# Patient Record
Sex: Male | Born: 1995
Health system: Southern US, Community
[De-identification: ages and names within clinical notes are randomized; demographics above are authoritative.]

---

## 2019-04-24 ENCOUNTER — Other Ambulatory Visit: Payer: Self-pay

## 2019-04-24 ENCOUNTER — Encounter (HOSPITAL_BASED_OUTPATIENT_CLINIC_OR_DEPARTMENT_OTHER): Payer: Self-pay | Admitting: Emergency Medicine

## 2019-04-24 ENCOUNTER — Emergency Department (HOSPITAL_BASED_OUTPATIENT_CLINIC_OR_DEPARTMENT_OTHER)
Admission: EM | Admit: 2019-04-24 | Discharge: 2019-04-24 | Disposition: A | Discharge status: Home / Self Care | Payer: Self-pay | Attending: Emergency Medicine | Admitting: Emergency Medicine

## 2019-04-24 DIAGNOSIS — M778 Other enthesopathies, not elsewhere classified: Secondary | ICD-10-CM

## 2019-04-24 DIAGNOSIS — M65821 Other synovitis and tenosynovitis, right upper arm: Secondary | ICD-10-CM | POA: Insufficient documentation

## 2019-04-24 NOTE — ED Triage Notes (Signed)
Pain to right elbow since yesterday.  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

## 2019-04-24 NOTE — Discharge Instructions (Signed)
Please rest the arm for the next day Apply ice to the area that hurts the most Take Ibuprofen 600mg  for pain every 6-8 hours Try a elbow brace to help with pain

## 2019-04-24 NOTE — ED Provider Notes (Signed)
[facility] EMERGENCY DEPARTMENT Provider Note   CSN: [id number] Arrival date & time: 04/24/19  6433     History Chief Complaint  Patient presents with  . Elbow Pain    Mitchell Leblanc is a 24 y.o. male who presents with right elbow pain.  Patient states that this morning he woke up with acute pain in the back of his right elbow.  He works in [employer] and does a lot of heavy lifting.  Bending his elbow makes it worse.  Rest makes it better.  He has tried not tried any medications for pain.  He has not had this pain before.  He states that he came here to get cleared to go back to work. He denies acute injury.  HPI     No past medical history on file.  There are no problems to display for this patient.   The histories are not reviewed yet. Please review them in the "History" navigator section and refresh this [place].     No family history on file.  Social History   Tobacco Use  . Smoking status: Not on file  Substance Use Topics  . Alcohol use: Not on file  . Drug use: Not on file    Home Medications Prior to Admission medications   Not on File    Allergies    Patient has no allergy information on record.  Review of Systems   Review of Systems  Musculoskeletal: Positive for arthralgias. Negative for joint swelling.  Neurological: Negative for weakness and numbness.    Physical Exam Updated Vital Signs BP 120/86 (BP Location: Left Arm)   Pulse 60   Temp 98.5 F (36.9 C) (Oral)   Resp 16   SpO2 100%   Physical Exam Vitals and nursing note reviewed.  Constitutional:      General: He is not in acute distress.    Appearance: He is well-developed.  HENT:     Head: Normocephalic and atraumatic.  Eyes:     General: No scleral icterus.       Right eye: No discharge.        Left eye: No discharge.     Conjunctiva/sclera: Conjunctivae normal.     Pupils: Pupils are equal, round, and reactive to light.  Cardiovascular:     Rate  and Rhythm: Normal rate.  Pulmonary:     Effort: Pulmonary effort is normal. No respiratory distress.  Abdominal:     General: There is no distension.  Musculoskeletal:     Cervical back: Normal range of motion.     Comments: Right elbow: No obvious swelling, deformity, or warmth. Tenderness to palpation over the triceps tendon and medial epicondyle. FROM - pt endorses pain when elbow is flexed past 90 degrees. 5/5 grip strength. Cap refill <2. N/V intact.   Skin:    General: Skin is warm and dry.  Neurological:     Mental Status: He is alert and oriented to person, place, and time.  Psychiatric:        Behavior: Behavior normal.     ED Results / Procedures / Treatments   Labs (all labs ordered are listed, but only abnormal results are displayed) Labs Reviewed - No data to display  EKG None  Radiology No results found.  Procedures Procedures (including critical care time)  Medications Ordered in ED Medications - No data to display  ED Course  I have reviewed the triage vital signs and the nursing notes.  Pertinent labs &  imaging results that were available during my care of the patient were reviewed by me and considered in my medical decision making (see chart for details).  24 year old male with atraumatic right elbow pain in the setting of frequent lifting for his job.  His vital signs are normal.  On exam he is tender over the triceps tendon and medial epicondyle.  He denies any acute trauma.  Imaging is not indicated.  Likely pain is due to tendinitis.  Conservative management discussed and patient given a work note.  MDM Rules/Calculators/A&P                      Final Clinical Impression(s) / ED Diagnoses Final diagnoses:  Triceps tendonitis    Rx / DC Orders ED Discharge Orders    None       Bethel Born, PA-C 04/24/19 3354    Little, Ambrose Finland, MD 04/24/19 1048

## 2019-04-24 NOTE — ED Notes (Signed)
ED Provider at bedside. 

## 2020-06-03 ENCOUNTER — Other Ambulatory Visit (HOSPITAL_COMMUNITY): Payer: Self-pay | Admitting: Physician Assistant

## 2020-06-03 ENCOUNTER — Other Ambulatory Visit: Payer: Self-pay

## 2020-06-03 ENCOUNTER — Encounter (HOSPITAL_BASED_OUTPATIENT_CLINIC_OR_DEPARTMENT_OTHER): Payer: Self-pay

## 2020-06-03 ENCOUNTER — Emergency Department (HOSPITAL_BASED_OUTPATIENT_CLINIC_OR_DEPARTMENT_OTHER): Payer: Self-pay

## 2020-06-03 ENCOUNTER — Emergency Department (HOSPITAL_BASED_OUTPATIENT_CLINIC_OR_DEPARTMENT_OTHER)
Admission: EM | Admit: 2020-06-03 | Discharge: 2020-06-03 | Disposition: A | Discharge status: Home / Self Care | Payer: Self-pay | Attending: Emergency Medicine | Admitting: Emergency Medicine

## 2020-06-03 DIAGNOSIS — S00412A Abrasion of left ear, initial encounter: Secondary | ICD-10-CM | POA: Insufficient documentation

## 2020-06-03 DIAGNOSIS — R519 Headache, unspecified: Secondary | ICD-10-CM | POA: Insufficient documentation

## 2020-06-03 DIAGNOSIS — Y9229 Other specified public building as the place of occurrence of the external cause: Secondary | ICD-10-CM | POA: Insufficient documentation

## 2020-06-03 DIAGNOSIS — W1839XA Other fall on same level, initial encounter: Secondary | ICD-10-CM | POA: Insufficient documentation

## 2020-06-03 DIAGNOSIS — F17200 Nicotine dependence, unspecified, uncomplicated: Secondary | ICD-10-CM | POA: Insufficient documentation

## 2020-06-03 MED ORDER — IBUPROFEN 800 MG PO TABS: 800.0000 mg | ORAL_TABLET | Freq: Three times a day (TID) | ORAL | 0 refills | Status: DC

## 2020-06-03 MED ORDER — KETOROLAC TROMETHAMINE 60 MG/2ML IM SOLN
60.0000 mg | Freq: Once | INTRAMUSCULAR | Status: AC
  Administered 2020-06-03: 60 mg via INTRAMUSCULAR
  Filled 2020-06-03: qty 2

## 2020-06-03 MED ORDER — ONDANSETRON 4 MG PO TBDP
4.0000 mg | ORAL_TABLET | Freq: Once | ORAL | Status: AC
  Administered 2020-06-03: 4 mg via ORAL
  Filled 2020-06-03: qty 1

## 2020-06-03 MED ORDER — ONDANSETRON 4 MG PO TBDP: 4.0000 mg | ORAL_TABLET | Freq: Three times a day (TID) | ORAL | 0 refills | Status: AC | PRN

## 2020-06-03 MED FILL — IBUPROFEN 800 MG TAB: 800 | 7 days supply | Qty: 21 | Fill #0

## 2020-06-03 MED FILL — ONDANSETRON ODT 4 MG TABLET: 4 | 7 days supply | Qty: 20 | Fill #0
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
## 2020-06-03 NOTE — Discharge Instructions (Addendum)
Your CT scan was overall reassuring, you may be experiencing signs of a concussion which includes headache, sensitivity to light and screens.  Please make sure to avoid screens for the next couple days, see the handout for additional recommendations for management of concussions.  Please return to the ER if you have worsening confusion, worsening vomiting, vision changes, increasing sleepiness etc.  You may take ibuprofen for pain, Zofran for nausea.

## 2021-09-16 IMAGING — CT CT HEAD W/O CM
3 series · 15 of 47 positions shown, 18 images · non-contrast
Comparison: Report from head CT 01/08/2009 (images unavailable).

CLINICAL DATA: Head trauma, moderate/severe

EXAM:
CT HEAD WITHOUT CONTRAST
TECHNIQUE: Contiguous axial images were obtained from the base of the skull
through the vertex without intravenous contrast.

[Series 2: head wo · axial · 0.45mm/px · z∈[-162,-37]mm · 9 of 31 slices shown, 12 images]
[im 3/31  brain]
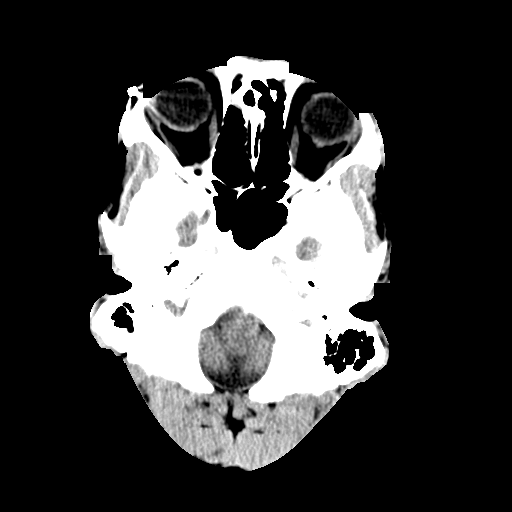
[im 3/31  bone]
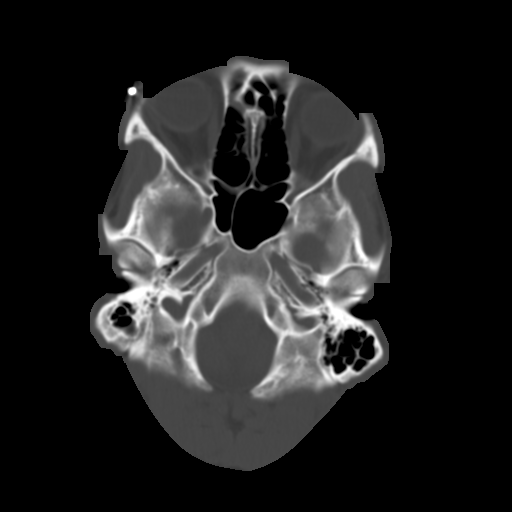
[im 6/31  brain]
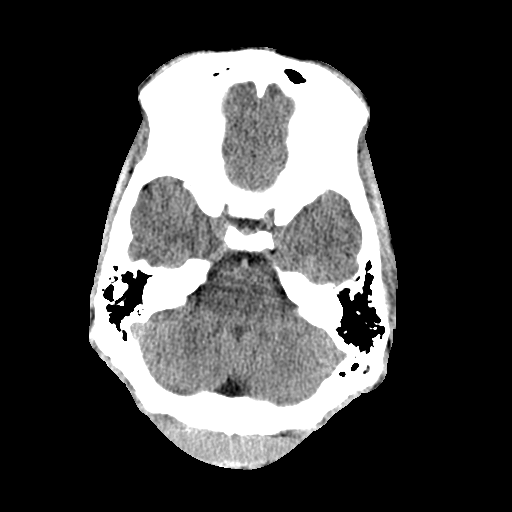
[im 9/31  brain]
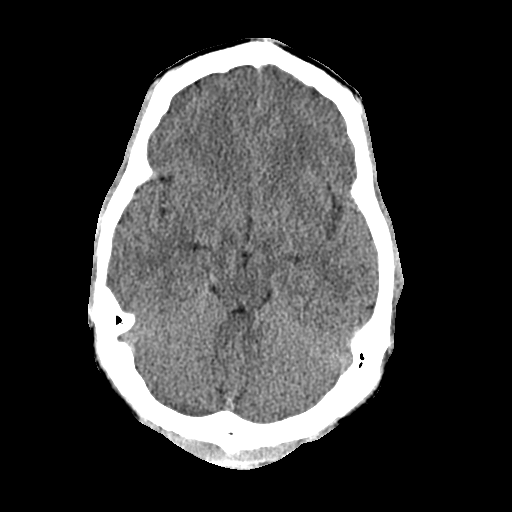
[im 12/31  brain]
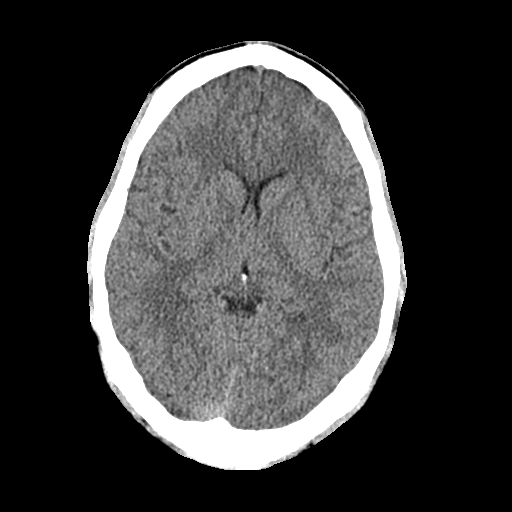
[im 16/31  brain]
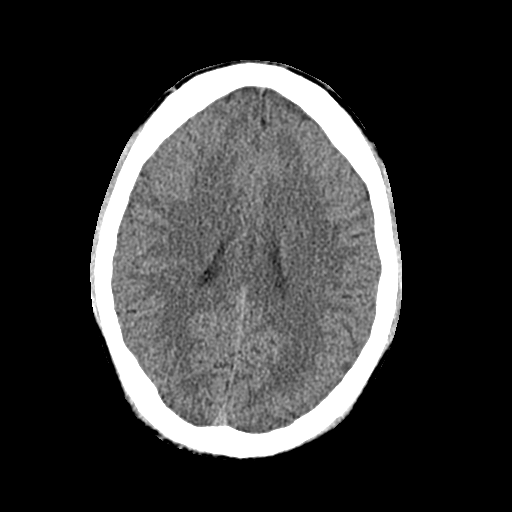
[im 16/31  bone]
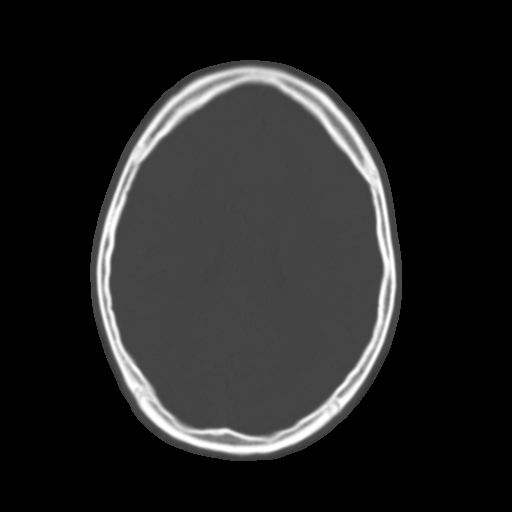
[im 19/31  brain]
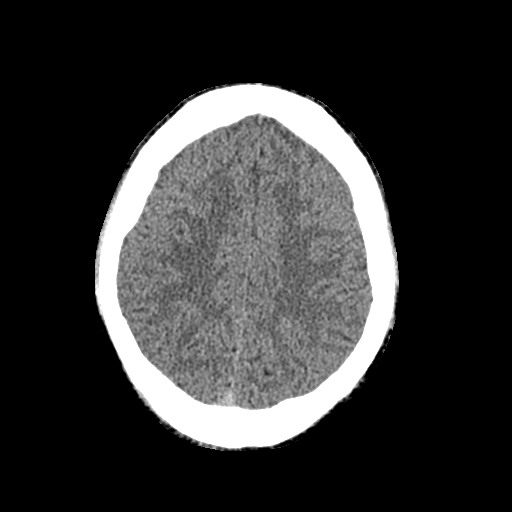
[im 22/31  brain]
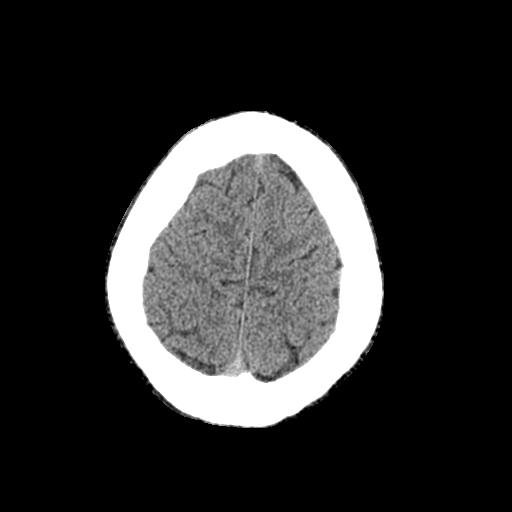
[im 25/31  brain]
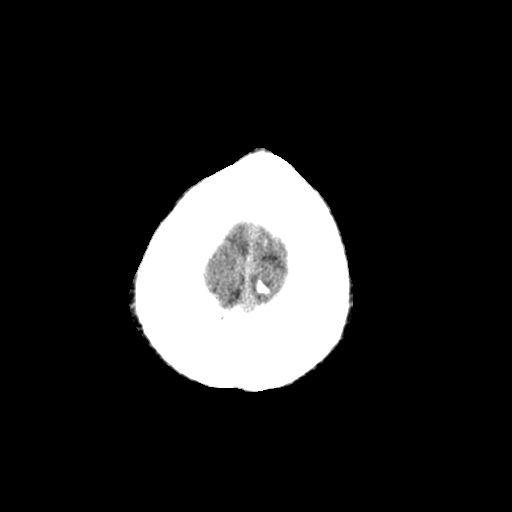
[im 28/31  brain]
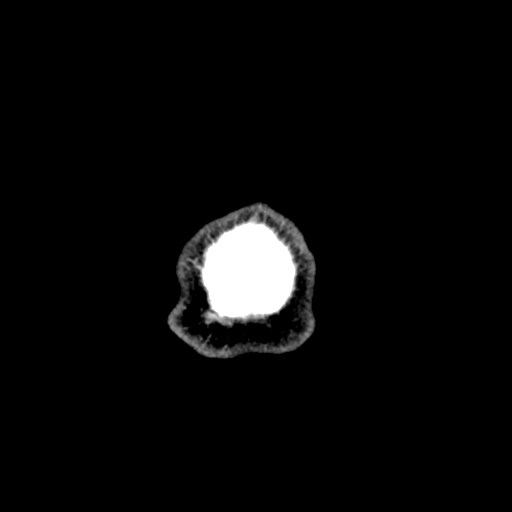
[im 28/31  bone]
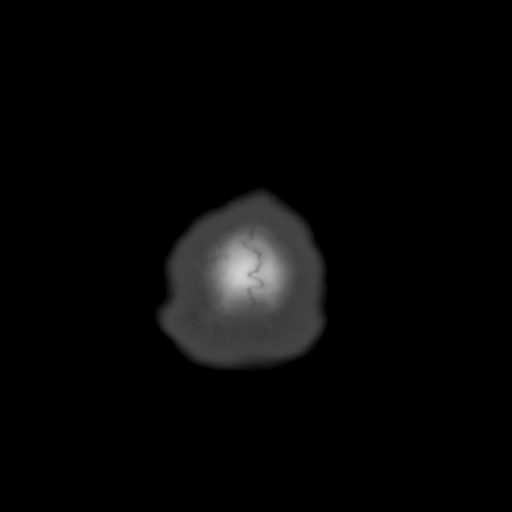

[Series 4: cor soft · coronal · 0.32mm/px · 3 of 69 slices shown]
[im 23/69  brain]
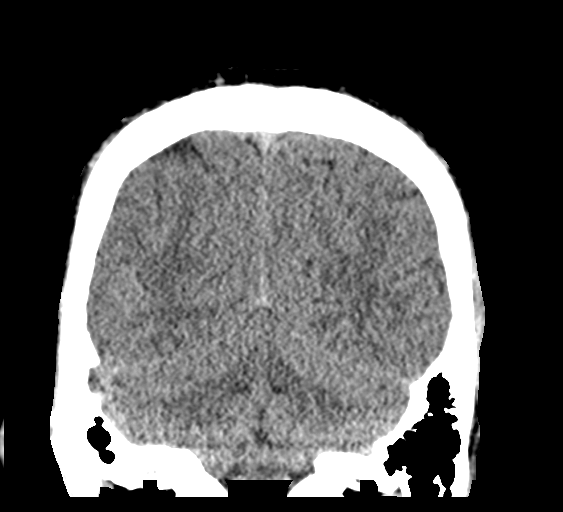
[im 31/69  brain]
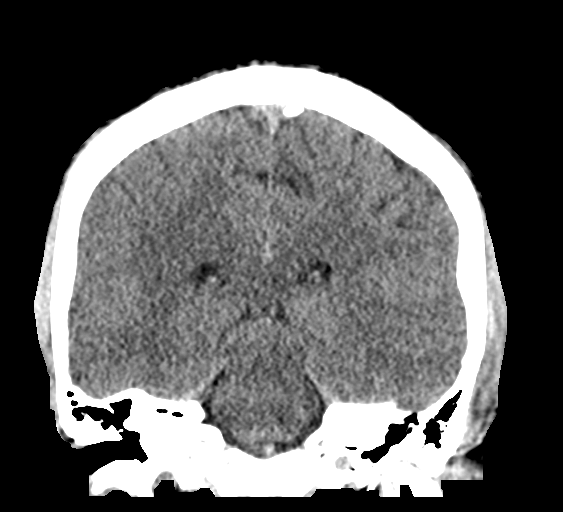
[im 38/69  brain]
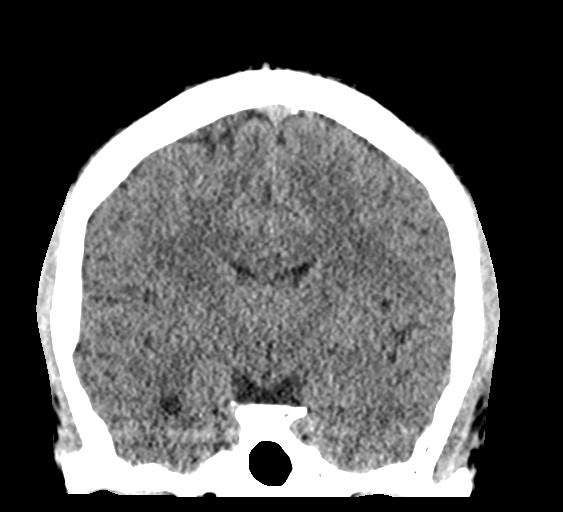

[Series 5: sag soft · sagittal · 0.32mm/px · 3 of 55 slices shown]
[im 19/55  brain]
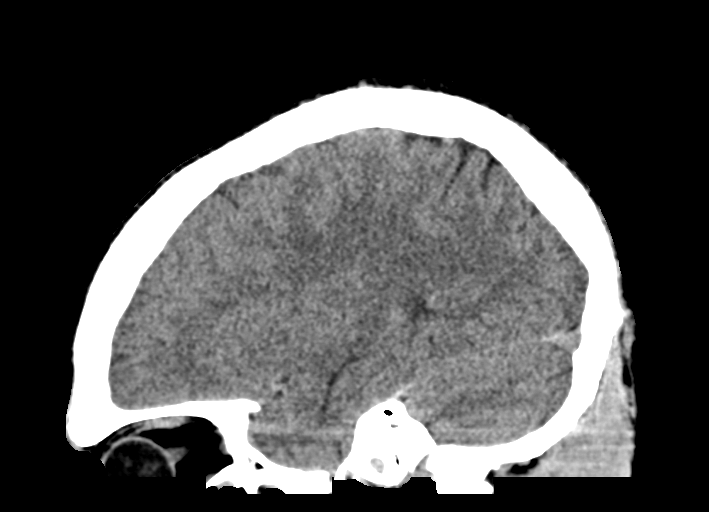
[im 28/55  brain]
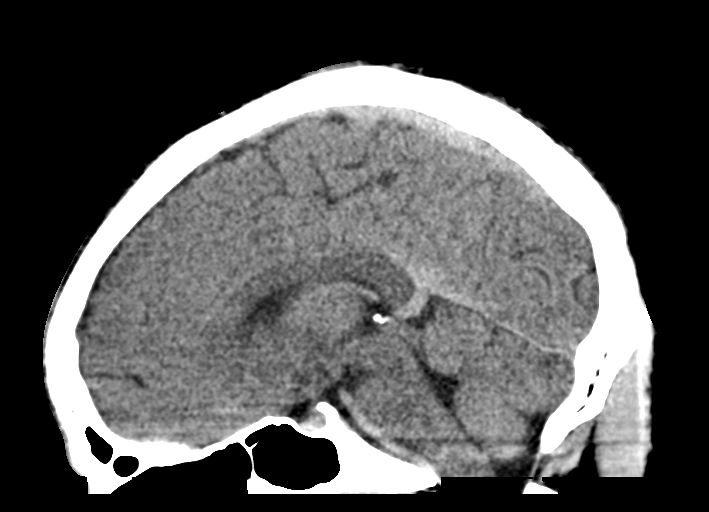
[im 37/55  brain]
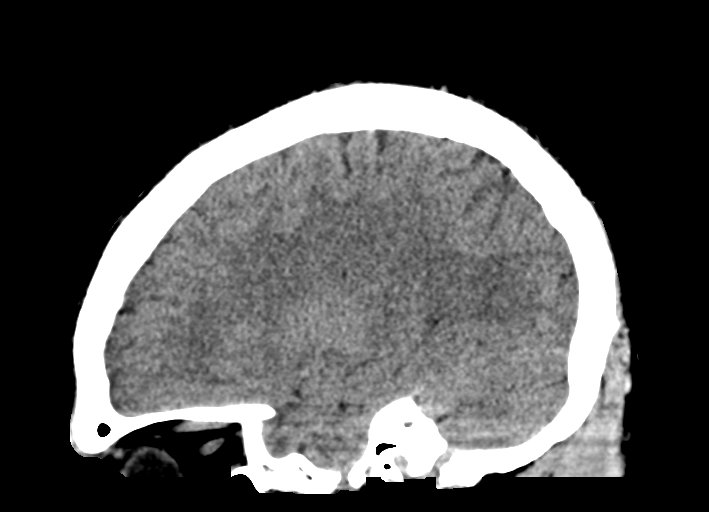

[15 of 47 positions shown; findings below may reference images not displayed]

FINDINGS: Brain:

Cerebral volume is normal.

There is no acute intracranial hemorrhage.

No demarcated cortical infarct.

No extra-axial fluid collection.

No evidence of intracranial mass.

No midline shift.

Vascular: No hyperdense vessel.

Skull: Normal. Negative for fracture or focal lesion.

Sinuses/Orbits: Visualized orbits show no acute finding. Mild right
frontal, bilateral ethmoid and right sphenoid sinus mucosal
thickening. Partially imaged right maxillary sinus mucous retention
cyst.

Other: Left temporal scalp and periauricular soft tissue swelling.
IMPRESSION: No evidence of acute intracranial abnormality.

Left temporal scalp and periauricular soft tissue swelling.

Mild paranasal sinus disease at the imaged levels, as described.

## 2021-11-09 ENCOUNTER — Encounter (HOSPITAL_BASED_OUTPATIENT_CLINIC_OR_DEPARTMENT_OTHER): Payer: Self-pay | Admitting: Emergency Medicine

## 2021-11-09 ENCOUNTER — Emergency Department (HOSPITAL_BASED_OUTPATIENT_CLINIC_OR_DEPARTMENT_OTHER): Payer: Self-pay

## 2021-11-09 ENCOUNTER — Other Ambulatory Visit: Payer: Self-pay

## 2021-11-09 ENCOUNTER — Emergency Department (HOSPITAL_BASED_OUTPATIENT_CLINIC_OR_DEPARTMENT_OTHER)
Admission: EM | Admit: 2021-11-09 | Discharge: 2021-11-09 | Disposition: A | Discharge status: Home / Self Care | Payer: Self-pay | Attending: Emergency Medicine | Admitting: Emergency Medicine

## 2021-11-09 DIAGNOSIS — M25512 Pain in left shoulder: Secondary | ICD-10-CM | POA: Insufficient documentation

## 2021-11-09 DIAGNOSIS — R519 Headache, unspecified: Secondary | ICD-10-CM | POA: Insufficient documentation

## 2021-11-09 DIAGNOSIS — Y9372 Activity, wrestling: Secondary | ICD-10-CM | POA: Insufficient documentation

## 2021-11-09 DIAGNOSIS — S62662A Nondisplaced fracture of distal phalanx of right middle finger, initial encounter for closed fracture: Secondary | ICD-10-CM | POA: Insufficient documentation

## 2021-11-09 DIAGNOSIS — W228XXA Striking against or struck by other objects, initial encounter: Secondary | ICD-10-CM | POA: Insufficient documentation
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
## 2021-11-09 NOTE — ED Notes (Signed)
Patient transported to X-ray 

## 2021-11-09 NOTE — Discharge Instructions (Signed)
Take 4 over the counter ibuprofen tablets 3 times a day or 2 over-the-counter naproxen tablets twice a day for pain. Also take tylenol 1000mg (2 extra strength) four times a day.    The sling is for your comfort only.  You need to take your arm out of the sling at least 4 times a day and perform range of motion exercises.
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
## 2021-11-09 NOTE — ED Notes (Signed)
D/c paperwork reviewed with pt, including f/u care. Pt verbalized understanding, no questions or concerns at time of d/c. Pt ambulatory to ED exit, NAD.

## 2022-05-10 ENCOUNTER — Emergency Department (HOSPITAL_BASED_OUTPATIENT_CLINIC_OR_DEPARTMENT_OTHER): Payer: Self-pay

## 2022-05-10 ENCOUNTER — Other Ambulatory Visit: Payer: Self-pay

## 2022-05-10 ENCOUNTER — Emergency Department (HOSPITAL_BASED_OUTPATIENT_CLINIC_OR_DEPARTMENT_OTHER)
Admission: EM | Admit: 2022-05-10 | Discharge: 2022-05-10 | Disposition: A | Discharge status: Home / Self Care | Payer: Self-pay | Attending: Emergency Medicine | Admitting: Emergency Medicine

## 2022-05-10 ENCOUNTER — Encounter (HOSPITAL_BASED_OUTPATIENT_CLINIC_OR_DEPARTMENT_OTHER): Payer: Self-pay | Admitting: Emergency Medicine

## 2022-05-10 DIAGNOSIS — M79672 Pain in left foot: Secondary | ICD-10-CM | POA: Insufficient documentation

## 2022-05-10 DIAGNOSIS — M549 Dorsalgia, unspecified: Secondary | ICD-10-CM | POA: Insufficient documentation

## 2022-05-10 DIAGNOSIS — Y9372 Activity, wrestling: Secondary | ICD-10-CM | POA: Insufficient documentation

## 2022-05-10 DIAGNOSIS — W108XXA Fall (on) (from) other stairs and steps, initial encounter: Secondary | ICD-10-CM | POA: Insufficient documentation

## 2022-05-10 MED ORDER — ACETAMINOPHEN 500 MG PO TABS: 500.0000 mg | ORAL_TABLET | Freq: Four times a day (QID) | ORAL | 0 refills | Status: AC | PRN

## 2022-05-10 MED ORDER — IBUPROFEN 800 MG PO TABS: 800.0000 mg | ORAL_TABLET | Freq: Three times a day (TID) | ORAL | 0 refills | Status: AC
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
## 2022-05-10 NOTE — ED Provider Notes (Signed)
[facility] Provider Note   CSN: [id number] Arrival date & time: 05/10/22  1258     History  Chief Complaint  Patient presents with   Foot Pain    Mitchell Leblanc is a 27 y.o. male presenting today with pain to his left foot and back.  Reports wrestling and having somebody fell onto his foot and also being thrown onto some stairs.  No bowel/bladder dysfunction, saddle anesthesia, leg weakness or falls.  Is able to bear weight on his left foot but says it is painful.   Foot Pain       Home Medications Prior to Admission medications   Medication Sig Start Date End Date Taking? Authorizing Provider  ibuprofen (ADVIL) 800 MG tablet Take 1 tablet (800 mg total) by mouth 3 (three) times daily. 06/03/20   Garald Balding, PA-C  ondansetron (ZOFRAN ODT) 4 MG disintegrating tablet Take 1 tablet (4 mg total) by mouth every 8 (eight) hours as needed for nausea or vomiting. 06/03/20   Garald Balding, PA-C      Allergies    Patient has no known allergies.    Review of Systems   Review of Systems  Physical Exam Updated Vital Signs BP (!) 124/90 (BP Location: Left Arm)   Pulse 99   Temp 98 F (36.7 C) (Oral)   Resp 16   Ht '6\' 2"'$  (1.88 m)   Wt 81.6 kg   SpO2 98%   BMI 23.11 kg/m  Physical Exam Vitals and nursing note reviewed.  Constitutional:      Appearance: Normal appearance.  HENT:     Head: Normocephalic and atraumatic.  Eyes:     General: No scleral icterus.    Conjunctiva/sclera: Conjunctivae normal.  Pulmonary:     Effort: Pulmonary effort is normal. No respiratory distress.  Musculoskeletal:     Comments: Full range of motion of all levels of the spine.  Full range of motion to the bilateral ankles, no problem with plantarflexion.  Strong DP pulses.  Normal radial pulses as well.  Skin:    Findings: No rash.  Neurological:     Mental Status: He is alert.  Psychiatric:        Mood and Affect: Mood normal.      ED Results / Procedures / Treatments   Labs (all labs ordered are listed, but only abnormal results are displayed) Labs Reviewed - No data to display  EKG None  Radiology DG Thoracic Spine 2 View  Result Date: 05/10/2022 CLINICAL DATA:  Acute mid back pain following injury two days ago. Initial encounter. EXAM: THORACIC SPINE 3 VIEWS COMPARISON:  None Available. FINDINGS: There is no evidence of thoracic spine fracture. Alignment is normal. No other significant bone abnormalities are identified. IMPRESSION: Negative. Electronically Signed   By: Margarette Canada M.D.   On: 05/10/2022 13:47   DG Lumbar Spine Complete  Result Date: 05/10/2022 CLINICAL DATA:  Acute low back pain following injury 2 days ago. Initial encounter. EXAM: LUMBAR SPINE - COMPLETE 5 VIEW COMPARISON:  None Available. FINDINGS: Five non rib-bearing lumbar type vertebra are identified in normal alignment. Disc spaces are maintained. There is no evidence of fracture or subluxation. No focal bony lesion or spondylolysis identified. IMPRESSION: Negative. Electronically Signed   By: Margarette Canada M.D.   On: 05/10/2022 13:46   DG Foot Complete Left  Result Date: 05/10/2022 CLINICAL DATA:  Acute LEFT foot pain following injury 2 days ago. Initial encounter.  EXAM: LEFT FOOT - COMPLETE 3+ VIEW COMPARISON:  None Available. FINDINGS: There is no evidence of fracture or dislocation. There is no evidence of arthropathy or other focal bone abnormality. Soft tissues are unremarkable. IMPRESSION: Negative. Electronically Signed   By: Margarette Canada M.D.   On: 05/10/2022 13:44    Procedures Procedures   Medications Ordered in ED Medications - No data to display  ED Course/ Medical Decision Making/ A&P                             Medical Decision Making Amount and/or Complexity of Data Reviewed Radiology: ordered.   27 year old male presenting today with joint pain after wrestling event.  X-rays ordered in triage, viewed and  interpreted by me and show no acute injuries of the left foot or thoracic and lumbar spine.  Do not believe any further imaging is indicated.  He is neurovascularly intact and agreeable to discharge.  Already has crutches but was offered an ASO brace but declined this.  Says that he feels comfortable going home at this time.  Encouraged ibuprofen and Tylenol use Final Clinical Impression(s) / ED Diagnoses Final diagnoses:  Foot pain, left    Rx / DC Orders ED Discharge Orders     None      Results and diagnoses were explained to the patient. Return precautions discussed in full. Patient had no additional questions and expressed complete understanding.   This chart was dictated using voice recognition software.  Despite best efforts to proofread,  errors can occur which can change the documentation meaning.     Rhae Hammock, PA-C 05/10/22 1440    Lennice Sites, DO 05/10/22 1502

## 2022-05-10 NOTE — Discharge Instructions (Addendum)
You came to the emergency department with a complaint of pain after a wrestling injury.  All of your x-rays are normal.  You may use ibuprofen and Tylenol for your pain.
